# Patient Record
Sex: Male | Born: 1950 | Race: White | Hispanic: No | Marital: Married | Smoking: Never smoker
Health system: Southern US, Community
[De-identification: ages and names within clinical notes are randomized; demographics above are authoritative.]

## PROBLEM LIST (undated history)

## (undated) DIAGNOSIS — K859 Acute pancreatitis without necrosis or infection, unspecified: Secondary | ICD-10-CM

## (undated) DIAGNOSIS — I10 Essential (primary) hypertension: Secondary | ICD-10-CM

## (undated) HISTORY — PX: BLADDER SURGERY: SHX569

---

## 2017-06-09 ENCOUNTER — Other Ambulatory Visit: Payer: Self-pay

## 2017-06-09 ENCOUNTER — Encounter: Payer: Self-pay | Admitting: *Deleted

## 2017-06-09 ENCOUNTER — Ambulatory Visit (INDEPENDENT_AMBULATORY_CARE_PROVIDER_SITE_OTHER)
Admission: EM | Admit: 2017-06-09 | Discharge: 2017-06-09 | Disposition: A | Discharge status: Other Acute Inpatient Hospital | Payer: Medicare HMO | Source: Home / Self Care | Attending: Family Medicine | Admitting: Family Medicine

## 2017-06-09 ENCOUNTER — Emergency Department: Payer: Medicare HMO

## 2017-06-09 ENCOUNTER — Emergency Department
Admission: EM | Admit: 2017-06-09 | Discharge: 2017-06-10 | Disposition: A | Discharge status: Home / Self Care | Payer: Medicare HMO | Attending: Emergency Medicine | Admitting: Emergency Medicine

## 2017-06-09 DIAGNOSIS — E7800 Pure hypercholesterolemia, unspecified: Secondary | ICD-10-CM | POA: Diagnosis not present

## 2017-06-09 DIAGNOSIS — I10 Essential (primary) hypertension: Secondary | ICD-10-CM

## 2017-06-09 DIAGNOSIS — Z79899 Other long term (current) drug therapy: Secondary | ICD-10-CM

## 2017-06-09 DIAGNOSIS — R002 Palpitations: Secondary | ICD-10-CM

## 2017-06-09 DIAGNOSIS — Z87891 Personal history of nicotine dependence: Secondary | ICD-10-CM

## 2017-06-09 DIAGNOSIS — R000 Tachycardia, unspecified: Secondary | ICD-10-CM

## 2017-06-09 DIAGNOSIS — Z7982 Long term (current) use of aspirin: Secondary | ICD-10-CM

## 2017-06-09 DIAGNOSIS — Z7951 Long term (current) use of inhaled steroids: Secondary | ICD-10-CM

## 2017-06-09 DIAGNOSIS — E119 Type 2 diabetes mellitus without complications: Secondary | ICD-10-CM

## 2017-06-09 DIAGNOSIS — Z7984 Long term (current) use of oral hypoglycemic drugs: Secondary | ICD-10-CM

## 2017-06-09 HISTORY — DX: Essential (primary) hypertension: I10

## 2017-06-09 HISTORY — DX: Acute pancreatitis without necrosis or infection, unspecified: K85.90

## 2017-06-09 LAB — CBC
HEMATOCRIT: 43.1 % (ref 40.0–52.0)
HEMOGLOBIN: 15.3 g/dL (ref 13.0–18.0)
MCH: 33 pg (ref 26.0–34.0)
MCHC: 35.4 g/dL (ref 32.0–36.0)
MCV: 93.3 fL (ref 80.0–100.0)
Platelets: 273 10*3/uL (ref 150–440)
RBC: 4.62 MIL/uL (ref 4.40–5.90)
RDW: 13.5 % (ref 11.5–14.5)
WBC: 10.7 10*3/uL — ABNORMAL HIGH (ref 3.8–10.6)

## 2017-06-09 LAB — BASIC METABOLIC PANEL
ANION GAP: 10 (ref 5–15)
BUN: 18 mg/dL (ref 6–20)
CO2: 23 mmol/L (ref 22–32)
Calcium: 9.5 mg/dL (ref 8.9–10.3)
Chloride: 104 mmol/L (ref 101–111)
Creatinine, Ser: 0.95 mg/dL (ref 0.61–1.24)
GFR calc non Af Amer: >60 mL/min (ref >60)
GLUCOSE: 106 mg/dL — AB (ref 65–99)
POTASSIUM: 4.2 mmol/L (ref 3.5–5.1)
Sodium: 137 mmol/L (ref 135–145)

## 2017-06-09 LAB — TROPONIN I: Troponin I: <0.03 ng/mL (ref <0.03)

## 2017-06-09 LAB — TSH: TSH: 2.94 u[IU]/mL (ref 0.350–4.500)

## 2017-06-09 MED ORDER — ASPIRIN 81 MG PO CHEW
324.0000 mg | CHEWABLE_TABLET | Freq: Once | ORAL | Status: AC
  Administered 2017-06-09: 324 mg via ORAL

## 2017-06-09 MED ORDER — SODIUM CHLORIDE 0.9 % IV BOLUS
1000.0000 mL | Freq: Once | INTRAVENOUS | Status: AC
  Administered 2017-06-09: 1000 mL via INTRAVENOUS

## 2017-06-09 MED ORDER — ATENOLOL 25 MG PO TABS: 25.0000 mg | ORAL_TABLET | Freq: Every day | ORAL | 0 refills | Status: AC

## 2017-06-09 MED ORDER — ATENOLOL 25 MG PO TABS
25.0000 mg | ORAL_TABLET | Freq: Once | ORAL | Status: AC
  Administered 2017-06-09: 25 mg via ORAL
  Filled 2017-06-09: qty 1

## 2017-06-09 MED ORDER — NITROGLYCERIN 2 % TD OINT
0.5000 [in_us] | TOPICAL_OINTMENT | Freq: Once | TRANSDERMAL | Status: AC
  Administered 2017-06-09: 0.5 [in_us] via TOPICAL

## 2017-06-09 NOTE — ED Triage Notes (Signed)
Pt reports "heart racing" starting 30 min prior to arrival. Starting to get a headache. Denies chest pain or other sx

## 2017-06-09 NOTE — ED Provider Notes (Signed)
[facility]    CSN: [id number] Arrival date & time: 06/09/17  1450     History   Chief Complaint Chief Complaint  Patient presents with  . Palpitations    HPI Henry Morton is a 67 y.o. male.   67 yo male with a h/o diabetes, hypertension, hypercholesterolemia, and former smoker presents with a c/o sudden onset of "palpitations" with heart racing at 120s-130 (recorded on his smartphone) while he was sitting calmly at his desk at work. States also felt flushed, sweaty and slight shortness of breath. Denies any chest pain. States he's never had any symptoms like this. No known history of CAD, however he does have multiple cardiac risk factors.  The history is provided by the patient.  Palpitations    Past Medical History:  Diagnosis Date  . Hypertension   . Pancreatitis     There are no active problems to display for this patient.   Past Surgical History:  Procedure Laterality Date  . BLADDER SURGERY         Home Medications    Prior to Admission medications   Medication Sig Start Date End Date Taking? Authorizing Provider  ALPRAZolam Prudy Feeler) 0.25 MG tablet Take 1 to 1 1/2 tablets at bedtime for sleep and anxiety 08/05/11  Yes [provider]  amLODipine (NORVASC) 5 MG tablet TAKE 1 TABLET EVERY DAY 10/29/16  Yes [provider]  APRISO 0.375 g 24 hr capsule Take four capsules (1.5 g total) by mouth every morning. 05/19/17  Yes [provider]  aspirin 81 MG chewable tablet Chew by mouth.   Yes [provider]  atorvastatin (LIPITOR) 10 MG tablet  05/01/17  Yes [provider]  cetirizine (ZYRTEC) 10 MG tablet Take by mouth.   Yes [provider]  fluticasone (FLONASE) 50 MCG/ACT nasal spray USE 2 SPRAYS NASALLY EVERY DAY 04/23/12  Yes [provider]  lisinopril (PRINIVIL,ZESTRIL) 20 MG tablet TAKE 1 TABLET EVERY DAY 10/29/16  Yes [provider]  metFORMIN (GLUCOPHAGE) 500 MG tablet  05/07/17   Yes [provider]  Multiple Vitamin (THERA) TABS Take by mouth.   Yes [provider]  omeprazole (PRILOSEC) 20 MG capsule TAKE 1 CAPSULE EVERY DAY 06/05/12  Yes [provider]  zolpidem (AMBIEN) 5 MG tablet Take by mouth. 01/30/12 04/28/18 Yes [provider]    Family History History reviewed. No pertinent family history.  Social History Social History   Tobacco Use  . Smoking status: Never Smoker  . Smokeless tobacco: Never Used  Substance Use Topics  . Alcohol use: Never    Frequency: Never  . Drug use: Never     Allergies   Patient has no known allergies.   Review of Systems Review of Systems  Cardiovascular: Positive for palpitations.     Physical Exam Triage Vital Signs ED Triage Vitals  Enc Vitals Group     BP 06/09/17 1500 (!) 164/98     Pulse Rate 06/09/17 1500 (!) 117     Resp 06/09/17 1500 20     Temp 06/09/17 1500 (!) 97.5 F (36.4 C)     Temp Source 06/09/17 1500 Oral     SpO2 06/09/17 1500 97 %     Weight 06/09/17 1459 230 lb (104.3 kg)     Height 06/09/17 1459 6' (1.829 m)     Head Circumference --      Peak Flow --      Pain Score 06/09/17 1457 0  Pain Loc --      Pain Edu? --      Excl. in GC? --    No data found.  Updated Vital Signs BP (!) 164/98 (BP Location: Left Arm)   Pulse (!) 117   Temp (!) 97.5 F (36.4 C) (Oral)   Resp 20   Ht 6' (1.829 m)   Wt 230 lb (104.3 kg)   SpO2 97%   BMI 31.19 kg/m   Visual Acuity Right Eye Distance:   Left Eye Distance:   Bilateral Distance:    Right Eye Near:   Left Eye Near:    Bilateral Near:     Physical Exam  Constitutional: He appears well-developed and well-nourished. No distress.  HENT:  Head: Atraumatic.  Mouth/Throat: Uvula is midline and mucous membranes are normal. No tonsillar abscesses.  Eyes: Right eye exhibits no discharge. Left eye exhibits no discharge. No scleral icterus.  Neck: Normal range of motion. Neck supple. No tracheal  deviation present. No thyromegaly present.  Cardiovascular: Regular rhythm, normal heart sounds and intact distal pulses. Tachycardia present.  Pulmonary/Chest: Effort normal and breath sounds normal. No stridor. No respiratory distress. He has no wheezes. He has no rales. He exhibits no tenderness.  Lymphadenopathy:    He has no cervical adenopathy.  Neurological: He is alert.  Skin: Skin is warm and dry. No rash noted. He is not diaphoretic.  Nursing note and vitals reviewed.    UC Treatments / Results  Labs (all labs ordered are listed, but only abnormal results are displayed) Labs Reviewed - No data to display  EKG None  Radiology No results found.  Procedures ED EKG Date/Time: 06/09/2017 3:37 PM Performed by: Payton Mccallum, MD Authorized by: Payton Mccallum, MD   ECG reviewed by ED Physician in the absence of a cardiologist: yes   Previous ECG:    Previous ECG:  Compared to current   Comparison ECG info:  New Q waves noted compared to prior   Similarity:  Changes noted Interpretation:    Interpretation: abnormal   Rate:    ECG rate:  118   ECG rate assessment: normal   Rhythm:    Rhythm: sinus tachycardia   Ectopy:    Ectopy: none   QRS:    QRS axis:  Normal Conduction:    Conduction: normal   ST segments:    ST segments:  Normal T waves:    T waves: normal   Q waves:    Q waves:  II, V1 and aVR   (including critical care time)  Medications Ordered in UC Medications  aspirin chewable tablet 324 mg (324 mg Oral Given 06/09/17 1531)  nitroGLYCERIN (NITROGLYN) 2 % ointment 0.5 inch (0.5 inches Topical Given 06/09/17 1546)    Initial Impression / Assessment and Plan / UC Course  I have reviewed the triage vital signs and the nursing notes.  Pertinent labs & imaging results that were available during my care of the patient were reviewed by me and considered in my medical decision making (see chart for details).      Final Clinical Impressions(s) / UC  Diagnoses   Final diagnoses:  Sinus tachycardia  Controlled type 2 diabetes mellitus without complication, without long-term current use of insulin (HCC)  Heart palpitations  Essential hypertension  Pure hypercholesterolemia   Discharge Instructions   None    ED Prescriptions    None      1. ekg results and possible diagnosis reviewed with patient; discussed with  patient due to multiple cardiac risk factors (including diabetes) and sudden onset of tachycardia at rest without know cause (as well as changes in EKG compared to prior),  he should proceed to ED by EMS for further evaluation and management.   Patient in stable condition. Patient was given 4 baby ASA, 1/2 inch NTG paste, IV saline lock. Report called to charge RN at [facility] ED.    Controlled Substance Prescriptions [place] Controlled Substance Registry consulted? Not Applicable   Payton Mccallum, MD 06/09/17 1556

## 2017-06-09 NOTE — ED Provider Notes (Addendum)
[facility] Emergency Department Provider Note  ____________________________________________  Time seen: Approximately 4:44 PM  I have reviewed the triage vital signs and the nursing notes.   HISTORY  Chief Complaint Tachycardia    HPI Henry Morton is a 67 y.o. male with a history of HTN presenting from urgent care for tachycardia.  The patient reports that he had 6 episodes of loose watery stool over the weekend, which resolved after taking Imodium.  He also never drinks caffeine and today had a diet Coke with lunch.  He was sitting at his desk after lunch when he developed a flushing sensation, felt warm in his face, with palpitations and his wife Told him that his heart rate was 130.  He denies any chest pain, tightness or pressure.  He did not have any syncope.  He does also note that he has been stable on atenolol for years but that his PMD has told him to stop that medication.  He cut it in half for several months, and 3 days ago stopped taking it altogether.  At this time, the patient's heart rate is 106 and he is feeling significantly better.  Past Medical History:  Diagnosis Date  . Hypertension   . Pancreatitis     There are no active problems to display for this patient.   Past Surgical History:  Procedure Laterality Date  . BLADDER SURGERY      Current Outpatient Rx  . Order #: [id number] Class: Historical Med  . Order #: [id number] Class: Historical Med  . Order #: [id number] Class: Historical Med  . Order #: [id number] Class: Historical Med  . Order #: [id number] Class: Print  . Order #: [id number] Class: Historical Med  . Order #: [id number] Class: Historical Med  . Order #: [id number] Class: Historical Med  . Order #: [id number] Class: Historical Med  . Order #: [id number] Class: Historical Med  . Order #: [id number] Class: Historical Med  . Order #: [id number] Class: Historical Med  . Order #: [id number] Class: Historical Med    Allergies Patient has no  known allergies.  History reviewed. No pertinent family history.  Social History Social History   Tobacco Use  . Smoking status: Never Smoker  . Smokeless tobacco: Never Used  Substance Use Topics  . Alcohol use: Never    Frequency: Never  . Drug use: Never    Review of Systems Constitutional: No fever/chills.  No lightheadedness or syncope.  Positive diaphoresis.  Positive flushing sensation. Eyes: No visual changes. ENT: No sore throat. No congestion or rhinorrhea. Cardiovascular: Denies chest pain.  Positive palpitations. Respiratory: Denies shortness of breath.  No cough. Gastrointestinal: No abdominal pain.  No nausea, no vomiting.  No diarrhea.  No constipation. Genitourinary: Negative for dysuria. Musculoskeletal: Negative for back pain.  No lower extremity swelling or calf pain. Skin: Negative for rash. Neurological: Negative for headaches. No focal numbness, tingling or weakness.     ____________________________________________   PHYSICAL EXAM:  VITAL SIGNS: ED Triage Vitals  Enc Vitals Group     BP 06/09/17 1629 (!) 151/100     Pulse Rate 06/09/17 1629 (!) 107     Resp 06/09/17 1629 20     Temp 06/09/17 1629 98.2 F (36.8 C)     Temp Source 06/09/17 1629 Oral     SpO2 06/09/17 1629 93 %     Weight 06/09/17 1630 230 lb (104.3 kg)     Height 06/09/17 1630 6' (1.829 m)     Head Circumference --      Peak Flow --  Pain Score 06/09/17 1630 0     Pain Loc --      Pain Edu? --      Excl. in GC? --     Constitutional: Alert and oriented. Well appearing and in no acute distress. Answers questions appropriately. Eyes: Conjunctivae are normal.  EOMI. No scleral icterus. Head: Atraumatic. Nose: No congestion/rhinnorhea. Mouth/Throat: Mucous membranes are moist.  Neck: No stridor.  Supple.  No JVD.  No meningismus. Cardiovascular: Normal rate, regular rhythm. No murmurs, rubs or gallops.  Respiratory: Normal respiratory effort.  No accessory muscle use  or retractions. Lungs CTAB.  No wheezes, rales or ronchi. Gastrointestinal: Obese.  Soft, nontender and nondistended.  No guarding or rebound.  No peritoneal signs. Musculoskeletal: No LE edema. No ttp in the calves or palpable cords.  Negative Homan's sign. Neurologic:  A&Ox3.  Speech is clear.  Face and smile are symmetric.  EOMI.  Moves all extremities well. Skin:  Skin is warm, dry and intact. No rash noted. Psychiatric: Mood and affect are normal. Speech and behavior are normal.  Normal judgement.  ____________________________________________   LABS (all labs ordered are listed, but only abnormal results are displayed)  Labs Reviewed  BASIC METABOLIC PANEL - Abnormal; Notable for the following components:      Result Value   Glucose, Bld 106 (*)    All other components within normal limits  CBC - Abnormal; Notable for the following components:   WBC 10.7 (*)    All other components within normal limits  TROPONIN I  TSH   ____________________________________________  EKG  ED ECG REPORT I, Rockne Menghini, the attending physician, personally viewed and interpreted this ECG.   Date: 06/09/2017  EKG Time: 1622  Rate: 109  Rhythm: sinus tachycardia  Axis: leftward  Intervals:first-degree A-V block   ST&T Change: No STEMI  ____________________________________________  RADIOLOGY  Dg Chest 2 View  Result Date: 06/09/2017 CLINICAL DATA:  Tachycardia EXAM: CHEST - 2 VIEW COMPARISON:  None. FINDINGS: Hyperexpanded lungs with mild interstitial prominence. The heart size and mediastinal contours are within normal limits. Both lungs are clear. The visualized skeletal structures are unremarkable. IMPRESSION: No active cardiopulmonary disease. Electronically Signed   By: Deatra Robinson M.D.   On: 06/09/2017 17:09    ____________________________________________   PROCEDURES  Procedure(s) performed: None  Procedures  Critical Care performed:  No ____________________________________________   INITIAL IMPRESSION / ASSESSMENT AND PLAN / ED COURSE  Pertinent labs & imaging results that were available during my care of the patient were reviewed by me and considered in my medical decision making (see chart for details).  67 y.o. male with a history of hypertension, recently discontinued atenolol 3 days ago, with recent diarrheal illness, new ingestion of caffeine, presenting with flushing and tachycardia.  Overall, the patient is mildly tachycardic but comfortable appearing.  We will give the patient intravenous fluids in case he is hypovolemic from his diarrhea.  I will also give him a dose of atenolol, as he may be rebounding from being off his beta-blocker.  In addition, the patient may have a response to caffeine.  We will also check his thyroid, electrolytes, make sure he is not anemic, and reevaluate the patient for final disposition.  There is no evidence of ischemia on his EKG.  ----------------------------------------- 5:57 PM on 06/09/2017 -----------------------------------------  The patient's heart rate is 80 at this time and he is completely asymptomatic.  His work-up in the emergency department has been reassuring.  His EKG does not  show ischemic changes in his troponin is less than 0.03.  His hemoglobin and hematocrit are normal.  His electrolytes are also reassuring.  The patient has a chest x-ray which did not show any acute process.  His TSH is in process and he can follow that up with his primary care physician.  I have discussed follow-up instructions, and return precautions with the patient and his wife.  ____________________________________________  FINAL CLINICAL IMPRESSION(S) / ED DIAGNOSES  Final diagnoses:  Sinus tachycardia  Palpitations         NEW MEDICATIONS STARTED DURING THIS VISIT:  New Prescriptions   ATENOLOL (TENORMIN) 25 MG TABLET    Take 1 tablet (25 mg total) by mouth daily.       Rockne Menghini, MD 06/09/17 1650    Rockne Menghini, MD 06/09/17 [phone]

## 2017-06-09 NOTE — Discharge Instructions (Addendum)
Please drink plenty of fluids to stay well-hydrated.  Please avoid caffeinated beverages.  Please start taking atenolol once daily and let your primary care physician know that you have restarted that medication.  Turn to the emergency department if you develop severe pain, lightheadedness or fainting, fever or chills, chest pain, shortness of breath, or any other symptoms concerning to you.

## 2017-06-09 NOTE — ED Triage Notes (Signed)
Pt to ED from [facility] after having a rapid HB and feeling anxious. Pt has a iwatch on that alerted him to the HR. No Chest pain and no SOB. Dry cough with seasonal allergies reported and diarrhea over the weekend that has subsided. NAD. Pt was given 324 aspirin and 0.5 inch of nitro paste with no change.   Pt has elevated BP but states he has "white coat syndrome."

## 2017-06-10 NOTE — ED Notes (Signed)
Delay in discharge. Pt left in otf.

## 2019-07-22 IMAGING — CR DG CHEST 2V
1 series · 2 of 2 positions shown · non-contrast
Comparison: None.

CLINICAL DATA: Tachycardia

EXAM:
CHEST - 2 VIEW

[Series 1: dg chest 2 view · 0.14mm/px · 2 of 2 slices shown]
[im 1/2]
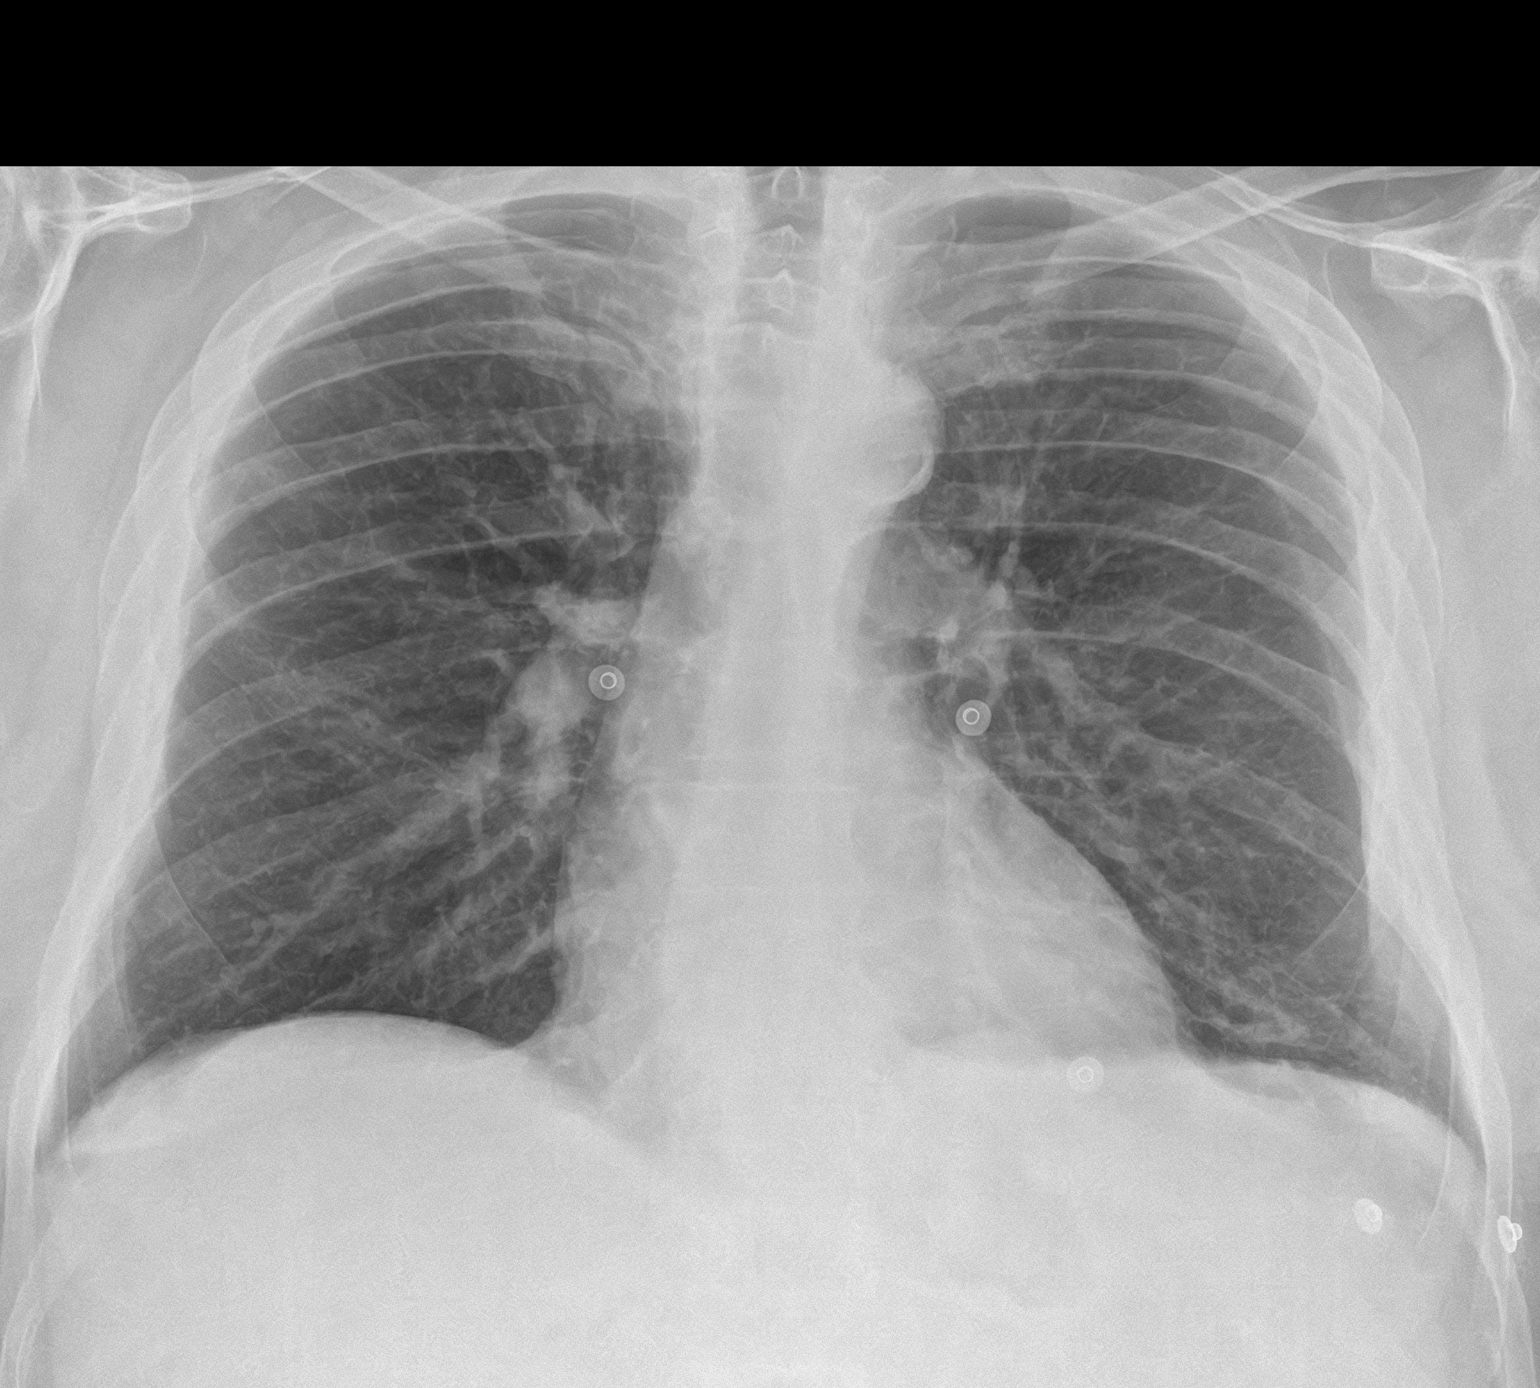
[im 2/2]
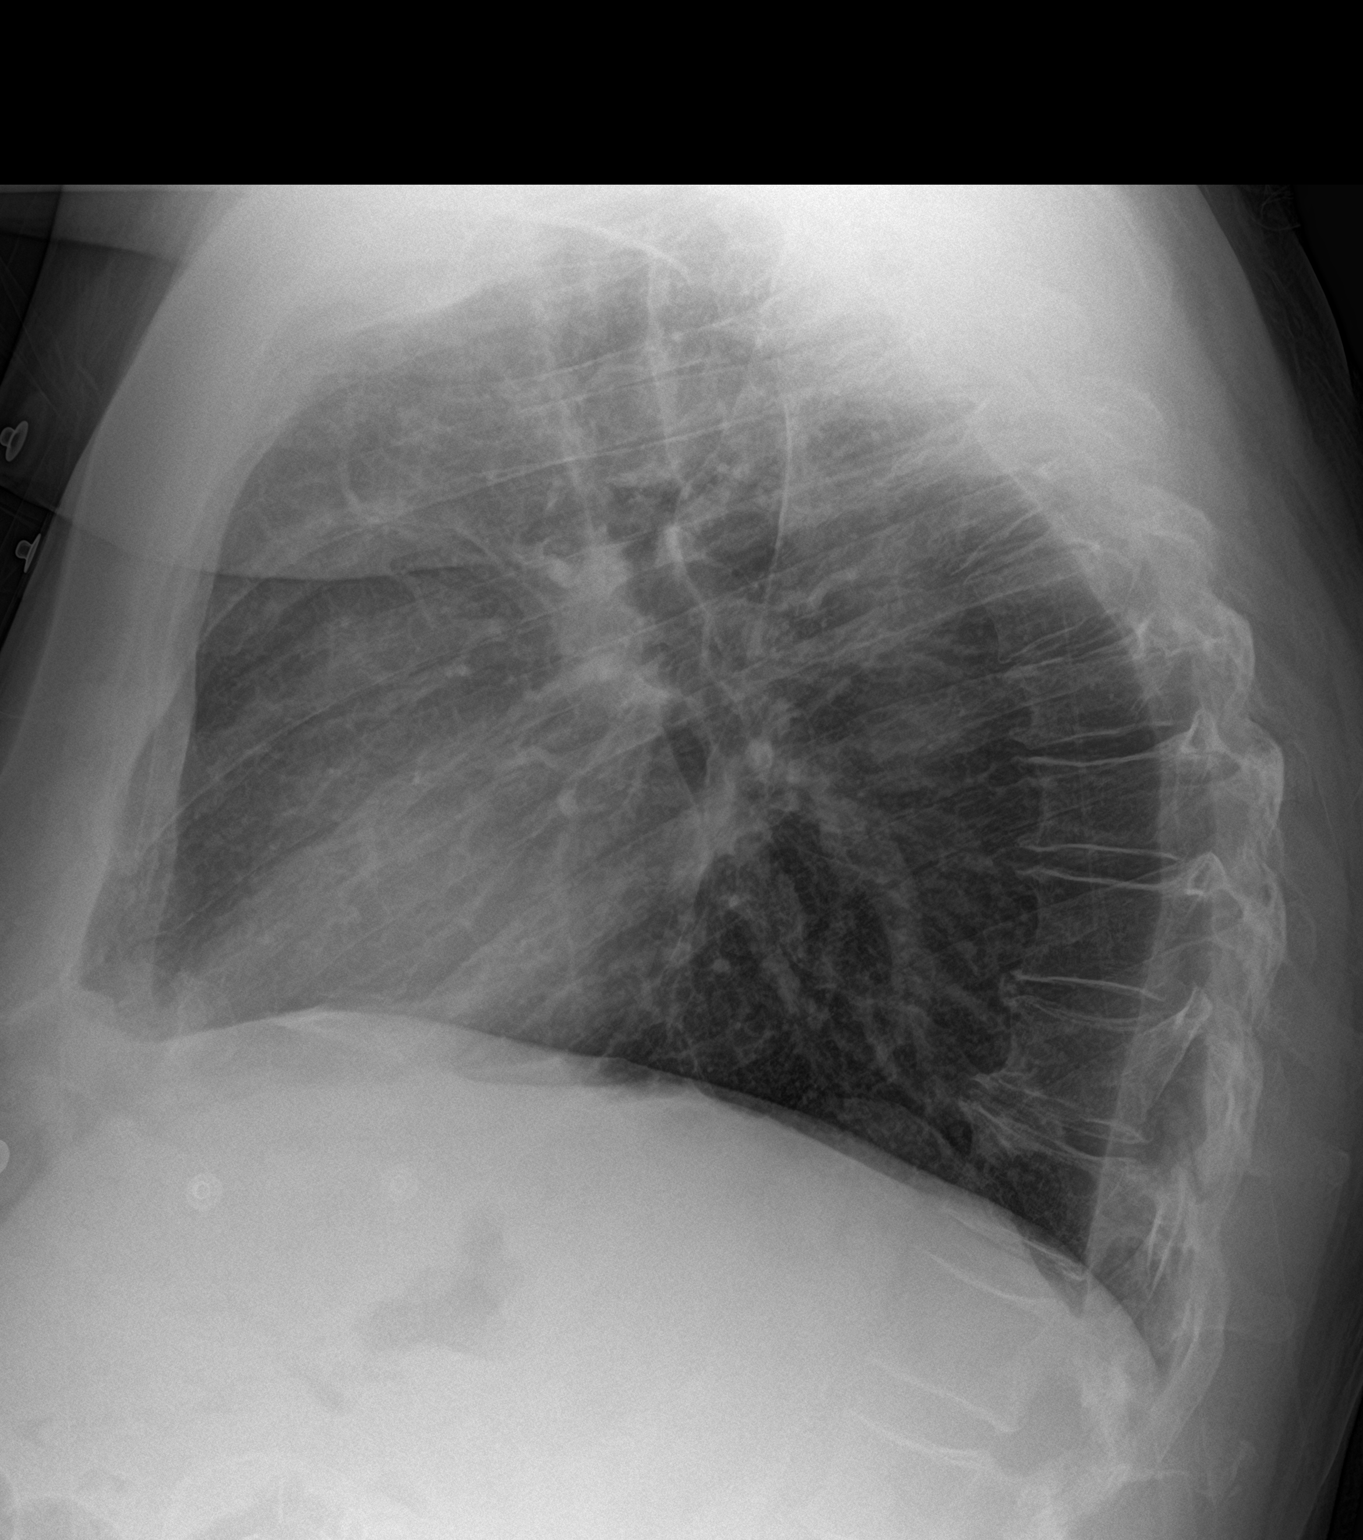

[2 of 2 positions shown; findings below may reference images not displayed]

FINDINGS: Hyperexpanded lungs with mild interstitial prominence. The heart
size and mediastinal contours are within normal limits. Both lungs
are clear. The visualized skeletal structures are unremarkable.
IMPRESSION: No active cardiopulmonary disease.
